# Patient Record
Sex: Female | Born: 1960 | Race: White | Hispanic: Yes | Marital: Married | Smoking: Never smoker
Health system: Southern US, Community
[De-identification: ages and names within clinical notes are randomized; demographics above are authoritative.]

## PROBLEM LIST (undated history)

## (undated) DIAGNOSIS — E785 Hyperlipidemia, unspecified: Secondary | ICD-10-CM

## (undated) DIAGNOSIS — E079 Disorder of thyroid, unspecified: Secondary | ICD-10-CM

---

## 2007-07-26 ENCOUNTER — Ambulatory Visit (HOSPITAL_COMMUNITY): Admission: RE | Admit: 2007-07-26 | Discharge: 2007-07-26 | Payer: Self-pay | Admitting: Obstetrics & Gynecology

## 2008-08-05 ENCOUNTER — Ambulatory Visit (HOSPITAL_COMMUNITY): Admission: RE | Admit: 2008-08-05 | Discharge: 2008-08-05 | Payer: Self-pay | Admitting: Obstetrics and Gynecology

## 2009-08-06 ENCOUNTER — Ambulatory Visit (HOSPITAL_COMMUNITY): Admission: RE | Admit: 2009-08-06 | Discharge: 2009-08-06 | Payer: Self-pay | Admitting: Obstetrics & Gynecology

## 2010-05-11 ENCOUNTER — Encounter (INDEPENDENT_AMBULATORY_CARE_PROVIDER_SITE_OTHER): Payer: Self-pay | Admitting: *Deleted

## 2010-05-11 LAB — CONVERTED CEMR LAB
Albumin: 4.4 g/dL (ref 3.5–5.2)
BUN: 7 mg/dL (ref 6–23)
Calcium: 9.9 mg/dL (ref 8.4–10.5)
Chlamydia, DNA Probe: NEGATIVE
Chloride: 102 meq/L (ref 96–112)
Cholesterol: 204 mg/dL — ABNORMAL HIGH (ref 0–200)
Glucose, Bld: 92 mg/dL (ref 70–99)
Hemoglobin: 12.2 g/dL (ref 12.0–15.0)
Platelets: 369 10*3/uL (ref 150–400)
Sodium: 139 meq/L (ref 135–145)
TSH: 2.109 microintl units/mL (ref 0.350–4.500)
Total CHOL/HDL Ratio: 5.7
VLDL: 54 mg/dL — ABNORMAL HIGH (ref 0–40)

## 2010-08-03 ENCOUNTER — Other Ambulatory Visit (HOSPITAL_COMMUNITY): Payer: Self-pay | Admitting: Family Medicine

## 2010-08-03 ENCOUNTER — Ambulatory Visit (HOSPITAL_COMMUNITY)
Admission: RE | Admit: 2010-08-03 | Discharge: 2010-08-03 | Disposition: A | Discharge status: Home / Self Care | Payer: Self-pay | Source: Ambulatory Visit | Attending: Family Medicine | Admitting: Family Medicine

## 2010-08-03 DIAGNOSIS — R7611 Nonspecific reaction to tuberculin skin test without active tuberculosis: Secondary | ICD-10-CM

## 2010-11-25 ENCOUNTER — Inpatient Hospital Stay (INDEPENDENT_AMBULATORY_CARE_PROVIDER_SITE_OTHER)
Admission: RE | Admit: 2010-11-25 | Discharge: 2010-11-25 | Disposition: A | Discharge status: Home / Self Care | Payer: Self-pay | Source: Ambulatory Visit | Attending: Emergency Medicine | Admitting: Emergency Medicine

## 2010-11-25 DIAGNOSIS — J040 Acute laryngitis: Secondary | ICD-10-CM

## 2010-11-25 DIAGNOSIS — J069 Acute upper respiratory infection, unspecified: Secondary | ICD-10-CM

## 2011-08-02 ENCOUNTER — Other Ambulatory Visit (HOSPITAL_COMMUNITY): Payer: Self-pay | Admitting: Family Medicine

## 2011-08-02 ENCOUNTER — Other Ambulatory Visit: Payer: Self-pay | Admitting: Family Medicine

## 2011-08-02 DIAGNOSIS — Z1231 Encounter for screening mammogram for malignant neoplasm of breast: Secondary | ICD-10-CM

## 2011-08-29 ENCOUNTER — Ambulatory Visit (HOSPITAL_COMMUNITY)
Admission: RE | Admit: 2011-08-29 | Discharge: 2011-08-29 | Disposition: A | Discharge status: Home / Self Care | Payer: Self-pay | Source: Ambulatory Visit | Attending: Family Medicine | Admitting: Family Medicine

## 2011-08-29 DIAGNOSIS — Z1231 Encounter for screening mammogram for malignant neoplasm of breast: Secondary | ICD-10-CM | POA: Insufficient documentation

## 2012-06-20 ENCOUNTER — Encounter (HOSPITAL_COMMUNITY): Payer: Self-pay | Admitting: *Deleted

## 2012-06-20 ENCOUNTER — Emergency Department (HOSPITAL_COMMUNITY)
Admission: EM | Admit: 2012-06-20 | Discharge: 2012-06-20 | Disposition: A | Discharge status: Home / Self Care | Payer: No Typology Code available for payment source | Source: Home / Self Care | Attending: Emergency Medicine | Admitting: Emergency Medicine

## 2012-06-20 DIAGNOSIS — J019 Acute sinusitis, unspecified: Secondary | ICD-10-CM

## 2012-06-20 DIAGNOSIS — J209 Acute bronchitis, unspecified: Secondary | ICD-10-CM

## 2012-06-20 HISTORY — DX: Hyperlipidemia, unspecified: E78.5

## 2012-06-20 MED ORDER — AMOXICILLIN 500 MG PO CAPS: 1000.0000 mg | ORAL_CAPSULE | Freq: Three times a day (TID) | ORAL | Status: DC

## 2012-06-20 MED ORDER — FEXOFENADINE-PSEUDOEPHED ER 60-120 MG PO TB12: 1.0000 | ORAL_TABLET | Freq: Two times a day (BID) | ORAL | Status: DC

## 2012-06-20 MED ORDER — BENZONATATE 200 MG PO CAPS: 200.0000 mg | ORAL_CAPSULE | Freq: Three times a day (TID) | ORAL | Status: DC | PRN

## 2012-06-20 MED ORDER — ALBUTEROL SULFATE HFA 108 (90 BASE) MCG/ACT IN AERS: 1.0000 | INHALATION_SPRAY | Freq: Four times a day (QID) | RESPIRATORY_TRACT | Status: DC | PRN

## 2012-06-20 NOTE — ED Provider Notes (Signed)
Chief Complaint  Patient presents with  . Influenza    History of Present Illness:   The patient is a 52 year old female with a one-week history of body aches, headache, sore throat, has felt feverish, has had cough productive yellow sputum, wheezing, chest pain, and nasal congestion. She denies fever, chills, or GI symptoms. She has not been exposed to anything in particular. She has not tried anything for symptomatic relief except for Mucinex D.  Review of Systems:  Other than noted above, the patient denies any of the following symptoms. Systemic:  No fever, chills, sweats, fatigue, myalgias, headache, or anorexia. Eye:  No redness, pain or drainage. ENT:  No earache, ear congestion, nasal congestion, sneezing, rhinorrhea, sinus pressure, sinus pain, post nasal drip, or sore throat. Lungs:  No cough, sputum production, wheezing, shortness of breath, or chest pain. GI:  No abdominal pain, nausea, vomiting, or diarrhea.  PMFSH:  Past medical history, family history, social history, meds, and allergies were reviewed.  Physical Exam:   Vital signs:  BP 124/68  Pulse 90  Temp 98.2 F (36.8 C) (Oral)  Resp 18  SpO2 99%  LMP 08/20/2011 General:  Alert, in no distress. Eye:  No conjunctival injection or drainage. Lids were normal. ENT:  TMs and canals were normal, without erythema or inflammation.  Nasal mucosa was clear and uncongested, without drainage.  Mucous membranes were moist.  Pharynx was clear, without exudate or drainage.  There were no oral ulcerations or lesions. Neck:  Supple, no adenopathy, tenderness or mass. Lungs:  No respiratory distress.  Lungs were clear to auscultation, without wheezes, rales or rhonchi.  Breath sounds were clear and equal bilaterally.  Heart:  Regular rhythm, without gallops, murmers or rubs. Skin:  Clear, warm, and dry, without rash or lesions.  Assessment:  The primary encounter diagnosis was Acute bronchitis. A diagnosis of Acute sinusitis was  also pertinent to this visit.  Plan:   1.  The following meds were prescribed:   New Prescriptions   ALBUTEROL (PROVENTIL HFA;VENTOLIN HFA) 108 (90 BASE) MCG/ACT INHALER    Inhale 1-2 puffs into the lungs every 6 (six) hours as needed for wheezing.   AMOXICILLIN (AMOXIL) 500 MG CAPSULE    Take 2 capsules (1,000 mg total) by mouth 3 (three) times daily.   BENZONATATE (TESSALON) 200 MG CAPSULE    Take 1 capsule (200 mg total) by mouth 3 (three) times daily as needed for cough.   FEXOFENADINE-PSEUDOEPHEDRINE (ALLEGRA-D) 60-120 MG PER TABLET    Take 1 tablet by mouth every 12 (twelve) hours.   2.  The patient was instructed in symptomatic care and handouts were given. 3.  The patient was told to return if becoming worse in any way, if no better in 3 or 4 days, and given some red flag symptoms that would indicate earlier return.   Reuben Likes, MD 06/20/12 1110

## 2012-06-20 NOTE — ED Notes (Signed)
Pt reports flu like symptoms, body aches, sore throat, fever, cough & congestion - no relief from otc meds

## 2013-04-18 ENCOUNTER — Encounter (HOSPITAL_COMMUNITY): Payer: Self-pay

## 2013-04-22 ENCOUNTER — Other Ambulatory Visit: Payer: Self-pay | Admitting: Obstetrics and Gynecology

## 2013-04-22 ENCOUNTER — Encounter (INDEPENDENT_AMBULATORY_CARE_PROVIDER_SITE_OTHER): Payer: Self-pay

## 2013-04-22 ENCOUNTER — Encounter (HOSPITAL_COMMUNITY): Payer: Self-pay

## 2013-04-22 ENCOUNTER — Ambulatory Visit (HOSPITAL_COMMUNITY)
Admission: RE | Admit: 2013-04-22 | Discharge: 2013-04-22 | Disposition: A | Discharge status: Home / Self Care | Payer: Self-pay | Source: Ambulatory Visit | Attending: Obstetrics and Gynecology | Admitting: Obstetrics and Gynecology

## 2013-04-22 VITALS — BP 100/64 | Temp 98.2°F | Ht 60.0 in | Wt 151.0 lb

## 2013-04-22 DIAGNOSIS — Z1239 Encounter for other screening for malignant neoplasm of breast: Secondary | ICD-10-CM

## 2013-04-22 DIAGNOSIS — Z1231 Encounter for screening mammogram for malignant neoplasm of breast: Secondary | ICD-10-CM | POA: Insufficient documentation

## 2013-04-22 DIAGNOSIS — Z0000 Encounter for general adult medical examination without abnormal findings: Secondary | ICD-10-CM

## 2013-04-22 DIAGNOSIS — R87610 Atypical squamous cells of undetermined significance on cytologic smear of cervix (ASC-US): Secondary | ICD-10-CM | POA: Insufficient documentation

## 2013-04-22 DIAGNOSIS — R87619 Unspecified abnormal cytological findings in specimens from cervix uteri: Secondary | ICD-10-CM | POA: Insufficient documentation

## 2013-04-22 HISTORY — DX: Disorder of thyroid, unspecified: E07.9

## 2013-04-22 NOTE — Patient Instructions (Signed)
Taught Stacy Wheeler how to perform BSE and gave educational materials to take home. Patient did not need a Pap smear today due to last Pap smear was 03/03/2013. Told patient she will need a repeat/follow-up Pap smear in March 2015. Let patient know will follow up with her within the next couple weeks with results for mammogram by letter or phone. Stacy Wheeler verbalized understanding. Patient escorted to mammography for a screening mammogram.  Lindell Tussey, Kathaleen Maser, RN 3:33 PM

## 2013-04-22 NOTE — Progress Notes (Signed)
Patient referred to [facility] by the [facility] due to an abnormal Pap smear 12/27/2012 recommending follow up.  Pap Smear:    Pap smear not completed today. Last Pap smear was 03/03/2013 at Dr. Jearl Klinefelter office and normal. Patient has a recent history of an abnormal Pap smear 12/27/2012 at the [facility] Department ASCUS HPV+ per patient was her first abnormal Pap smear. Patient needs a follow up Pap smear in 6 months.  Pap smear result from 12/27/2012 is scanned in EPIC under media. Result on 03/03/2013 will be scanned in EPIC under media.  Physical exam: Breasts Breasts symmetrical. No skin abnormalities bilateral breasts. No nipple retraction bilateral breasts. No nipple discharge bilateral breasts. No lymphadenopathy. No lumps palpated bilateral breasts. No complaints of pain or tenderness on exam. Patient escorted to mammography for a screening mammogram.        Pelvic/Bimanual No Pap smear completed today since last Pap smear was 03/03/2013. Pap smear not indicated per [facility]CP guidelines.

## 2013-05-19 ENCOUNTER — Encounter: Payer: Self-pay | Admitting: Obstetrics and Gynecology

## 2013-05-19 ENCOUNTER — Ambulatory Visit (INDEPENDENT_AMBULATORY_CARE_PROVIDER_SITE_OTHER): Payer: Self-pay | Admitting: Obstetrics and Gynecology

## 2013-05-19 ENCOUNTER — Other Ambulatory Visit (HOSPITAL_COMMUNITY)
Admission: RE | Admit: 2013-05-19 | Discharge: 2013-05-19 | Disposition: A | Discharge status: Home / Self Care | Payer: Self-pay | Source: Ambulatory Visit | Attending: Obstetrics and Gynecology | Admitting: Obstetrics and Gynecology

## 2013-05-19 VITALS — BP 129/78 | HR 84 | Temp 97.6°F | Ht 61.0 in | Wt 152.6 lb

## 2013-05-19 DIAGNOSIS — R87810 Cervical high risk human papillomavirus (HPV) DNA test positive: Secondary | ICD-10-CM

## 2013-05-19 DIAGNOSIS — R87610 Atypical squamous cells of undetermined significance on cytologic smear of cervix (ASC-US): Secondary | ICD-10-CM

## 2013-05-19 DIAGNOSIS — Z01812 Encounter for preprocedural laboratory examination: Secondary | ICD-10-CM

## 2013-05-19 NOTE — Progress Notes (Signed)
Patient ID: Stacy Wheeler, female   DOB: 08-07-1960, 52 y.o.   MRN: [id number] 52 yo G4P4 with ASCUS pap +HPV here for colposcopy  Patient given informed consent, signed copy in the chart, time out was performed.  Placed in lithotomy position. Cervix viewed with speculum and colposcope after application of acetic acid.   Colposcopy adequate?  No, TZ not visualized Acetowhite lesions?no. Atrophic appearing cervix  Punctation?no Mosaicism?  no Abnormal vasculature?  no Biopsies?no ECC?yes  COMMENTS: Patient was given post procedure instructions.  She will return in 6 months for repeat pap smear.

## 2013-05-21 ENCOUNTER — Encounter: Payer: Self-pay | Admitting: *Deleted

## 2013-10-21 ENCOUNTER — Encounter (HOSPITAL_COMMUNITY): Payer: Self-pay

## 2013-10-21 ENCOUNTER — Ambulatory Visit (HOSPITAL_COMMUNITY)
Admission: RE | Admit: 2013-10-21 | Discharge: 2013-10-21 | Disposition: A | Discharge status: Home / Self Care | Payer: Self-pay | Source: Ambulatory Visit | Attending: Obstetrics and Gynecology | Admitting: Obstetrics and Gynecology

## 2013-10-21 ENCOUNTER — Encounter (INDEPENDENT_AMBULATORY_CARE_PROVIDER_SITE_OTHER): Payer: Self-pay

## 2013-10-21 VITALS — BP 112/78 | Temp 98.2°F | Ht 60.0 in | Wt 157.4 lb

## 2013-10-21 DIAGNOSIS — Z01419 Encounter for gynecological examination (general) (routine) without abnormal findings: Secondary | ICD-10-CM

## 2013-10-21 NOTE — Patient Instructions (Addendum)
Next Pap smear and follow-up will be based on the results of today's Pap smear. Let patient know will follow up with her within the next couple weeks with results by phone. Told patient she will need a mammogram in November and that she will need  to call Martie LeeSabrina to renew her B[facility]CP card for it to be covered by [facility].  Franciso Bendeina Ventura de Sherlon HandingRodriguez verbalized understanding.  Saintclair Halstedhristine Poll [place], RN 11:03 AM

## 2013-10-21 NOTE — Progress Notes (Signed)
No complaints today.  Pap Smear:  Pap smear completed today. Last Pap smear was 03/03/2013 at Dr. Jearl KlinefelterHooper's office and normal. Pap smear completed today due to the recommendation of a repeat Pap smear in 6 months. Patient has a recent history of an abnormal Pap [facility] ASCUS HPV+ per patient was her first abnormal Pap smear. Patient needs a follow up Pap smear in 6 months. Pap smear result from 12/27/2012 is scanned in EPIC under media. Result on 03/03/2013 will be scanned in EPIC under media.    Pelvic/Bimanual   Ext Genitalia No lesions, no swelling and no discharge observed on external genitalia.         Vagina Vagina pink and normal texture. No lesions or discharge observed in vagina.          Cervix Cervix is present. Cervix pink and of normal texture. No discharge observed.     Uterus Uterus is present and palpable. Uterus in normal position and normal size.        Adnexae Bilateral ovaries present and palpable. No tenderness on palpation.          Rectovaginal No rectal exam completed today since patient had no rectal complaints. No skin abnormalities observed on exam.

## 2013-10-23 ENCOUNTER — Telehealth (HOSPITAL_COMMUNITY): Payer: Self-pay | Admitting: *Deleted

## 2013-10-23 NOTE — Telephone Encounter (Signed)
Telephoned patient at home # and discussed normal pap smear results. Next pap smear due in one year. Patient voiced understanding. Used interpreter Delorise RoyalsJulie Sowell.

## 2013-10-31 ENCOUNTER — Other Ambulatory Visit: Payer: Self-pay

## 2013-10-31 ENCOUNTER — Ambulatory Visit (HOSPITAL_BASED_OUTPATIENT_CLINIC_OR_DEPARTMENT_OTHER): Payer: Self-pay

## 2013-10-31 VITALS — BP 110/72 | HR 62 | Temp 98.0°F | Resp 12 | Ht 61.0 in | Wt 158.6 lb

## 2013-10-31 DIAGNOSIS — Z0000 Encounter for general adult medical examination without abnormal findings: Secondary | ICD-10-CM

## 2013-10-31 LAB — LIPID PANEL
Cholesterol: 222 mg/dL — ABNORMAL HIGH (ref 0–200)
HDL: 41 mg/dL (ref >39)
LDL Cholesterol: 155 mg/dL — ABNORMAL HIGH (ref 0–99)
Total CHOL/HDL Ratio: 5.4 Ratio
Triglycerides: 132 mg/dL (ref <150)
VLDL: 26 mg/dL (ref 0–40)

## 2013-10-31 LAB — HEMOGLOBIN A1C
HEMOGLOBIN A1C: 6.2 % — AB (ref <5.7)
MEAN PLASMA GLUCOSE: 131 mg/dL — AB (ref <117)

## 2013-10-31 LAB — GLUCOSE (CC13): GLUCOSE: 89 mg/dL (ref 70–140)

## 2013-10-31 NOTE — Progress Notes (Signed)
Patient is a [facility] and is currently a [facility] patient effective 04/22/2013.   Clinical Measurements: Patient is 5 ft. 1 inches, weight 158.6 lbs, waist circumference 42 inches, and hip circumference 33 inches.   Medical History: Patient has history of high cholesterol but tale her own formula of aloe and other ingredients.. Patient does not have a history of hypertension or diabetes. Per patient no diagnosed history of coronary heart disease, heart attack, heart failure, stroke/TIA, vascular disease or congenital heart defects.   Blood Pressure, Self-measurement: Patient states has no reason to check Blood pressure.  Nutrition Assessment: Patient stated that eats 2-3 fruits every day. Patient states she eats 0 to one servings of vegetables a day. Patient eats 3 or more ounces of whole grains daily. Patient doesn't eat two or more servings of fish weekly. . Patient states she does not drink more than 36 ounces or 450 calories of beverages with added sugars weekly. Patient states she drinks a lot of water. Patient stated she does watch her salt intake.  Physical Activity Assessment: Patient stated she does around 855 minutes of walking and household chores a week. Does no vigorous activity.  Smoking Status: Patient had never smoked.   Quality of Life Assessment: In assessing patient's quality of life she stated that out of the past 30 days that she has felt her health is good all of them. Patient also stated that in the past 30 days that her mental health is not good including stress, depression and problems with emotions for 5 days. Patient did state that out of the past 30 days she felt her physical or mental health had not kept her from doing her usual activities including self-care, work or recreation.   Plan: Lab work will be done today including a lipid panel, blood glucose, and Hgb A1C. Will call lab results when they are finished. Patient will return for Health  Coaching and BP check. Will work on obtaining assistance for her child with mental health issues.

## 2013-10-31 NOTE — Patient Instructions (Addendum)
  Discussed health assessment with patient. She will be called with results of lab work.Will schedule Health Coaching in future.. Patient verbalized understanding.

## 2013-11-07 ENCOUNTER — Telehealth: Payer: Self-pay

## 2013-11-07 NOTE — Telephone Encounter (Signed)
Interpreter, Butler Denmark patient lab results: Bld. Glucose - 89, HgbA1C -6.2, Cholesterol 222, HDL - 41, LDL - 155, Triglycerides - 132. Working on getting doctor appointment. Scheduled Health Coach appointment for November 21, 2013 to disuss ways to improve Cholesterol, LDL's and A1C level. Patient verbalized understanding.

## 2013-11-20 ENCOUNTER — Ambulatory Visit: Payer: Self-pay | Admitting: Internal Medicine

## 2013-11-21 ENCOUNTER — Ambulatory Visit: Payer: Self-pay

## 2013-11-21 DIAGNOSIS — Z789 Other specified health status: Secondary | ICD-10-CM

## 2013-11-21 NOTE — Progress Notes (Signed)
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   NUTRITION: Patient had appointment [facility] on June fourth but was not informed about the appointment due to no interpreter available to call. Discussed watching carbohydrates, how to count them, serving sizes and number of carbs per day allowance. Discussed the need to increase fiber in diet. Ng ngPatient received and reviewed the following handouts in Spanish: two different My Plate, and Carb counting Menu. Gave patient measuring cup to measure serving sizes and demonstrated the serving sizes.  ACTIVITY: Discussed increasing activity by walking. Patient received pedometer and was explained and shown how it works.  Miscellaneous: Discussed intensively patients best option for follow up care including [facility] and [employer].   PLAN:  Pursue orange card at [employer] and [employer].Increasing amount of walking per day and add other exercises to plan, Decrease carbohydrates in diet

## 2013-11-21 NOTE — Patient Instructions (Signed)
Patient will keep count of carbohydrates that eat in a day. Measure serving sizes with measuring cup. Decrease number of carbohydrates eat to less than 200 Gm a day. Wear pedometer and walk at least 5,000 steps a day increasing to 10,000 steps a day. Call if have any questions.

## 2014-04-20 ENCOUNTER — Encounter (HOSPITAL_COMMUNITY): Payer: Self-pay

## 2015-02-19 ENCOUNTER — Ambulatory Visit: Payer: Self-pay | Attending: Internal Medicine

## 2015-03-02 ENCOUNTER — Ambulatory Visit: Payer: Self-pay | Attending: Internal Medicine

## 2015-03-02 ENCOUNTER — Ambulatory Visit: Payer: Self-pay

## 2016-09-19 ENCOUNTER — Other Ambulatory Visit: Payer: Self-pay | Admitting: Obstetrics and Gynecology

## 2016-09-19 DIAGNOSIS — Z1231 Encounter for screening mammogram for malignant neoplasm of breast: Secondary | ICD-10-CM

## 2016-10-03 ENCOUNTER — Encounter (HOSPITAL_COMMUNITY): Payer: Self-pay

## 2016-10-03 ENCOUNTER — Ambulatory Visit
Admission: RE | Admit: 2016-10-03 | Discharge: 2016-10-03 | Disposition: A | Discharge status: Home / Self Care | Payer: No Typology Code available for payment source | Source: Ambulatory Visit | Attending: Obstetrics and Gynecology | Admitting: Obstetrics and Gynecology

## 2016-10-03 ENCOUNTER — Ambulatory Visit (HOSPITAL_COMMUNITY)
Admission: RE | Admit: 2016-10-03 | Discharge: 2016-10-03 | Disposition: A | Discharge status: Home / Self Care | Payer: Self-pay | Source: Ambulatory Visit | Attending: Obstetrics and Gynecology | Admitting: Obstetrics and Gynecology

## 2016-10-03 VITALS — BP 142/96 | Temp 98.2°F | Ht 62.0 in | Wt 162.4 lb

## 2016-10-03 DIAGNOSIS — Z1231 Encounter for screening mammogram for malignant neoplasm of breast: Secondary | ICD-10-CM

## 2016-10-03 DIAGNOSIS — Z01419 Encounter for gynecological examination (general) (routine) without abnormal findings: Secondary | ICD-10-CM

## 2016-10-03 NOTE — Patient Instructions (Signed)
Explained breast self awareness with Franciso Bend de Sherlon Handing. Let patient know that if today's Pap smear is normal that her next Pap smear is due in one year due to her recent history of an abnormal Pap smear. Referred patient to the [facility] for a screening mammogram. Appointment scheduled for Tuesday, October 03, 2016 at 0930. Let patient know will follow up with her within the next couple weeks with results of Pap smear by phone. Informed patient that the Breast Center will follow up with her within the next couple of weeks with results of mammogram by letter or phone. Franciso Bend de Sherlon Handing verbalized understanding.  Berdell Hostetler, Kathaleen Maser, RN 12:13 PM

## 2016-10-03 NOTE — Progress Notes (Signed)
Pap smear completed

## 2016-10-03 NOTE — Progress Notes (Signed)
No complaints today.   Pap Smear: Pap smear completed today. Last Pap smear was 10/21/2013 in [facility] clinic and normal. Patient has a history of an abnormal Pap smear 12/27/2012 at the [facility] Department ASCUS HPV+ per patient was her first abnormal Pap smear and a repeat Pap smear in 6 months was recommended. Pap smear result from 12/27/2012 is scanned in EPIC under media.   Physical exam: Breasts Breasts symmetrical. No skin abnormalities bilateral breasts. No nipple retraction bilateral breasts. No nipple discharge bilateral breasts. No lymphadenopathy. No lumps palpated bilateral breasts. No complaints of pain or tenderness on exam. Referred patient to the [facility] for a screening mammogram. Appointment scheduled for Tuesday, October 03, 2016 at 0930.  Pelvic/Bimanual   Ext Genitalia No lesions, no swelling and no discharge observed on external genitalia.         Vagina Vagina pink and normal texture. No lesions or discharge observed in vagina.          Cervix Cervix is present. Cervix pink and of normal texture. No discharge observed.     Uterus Uterus is present and palpable. Uterus in normal position and normal size.        Adnexae Bilateral ovaries present and palpable. No tenderness on palpation.          Rectovaginal No rectal exam completed today since patient had no rectal complaints. No skin abnormalities observed on exam.    Smoking History: Patient has never smoked.  Patient Navigation: Patient education provided. Access to services provided for patient through [facility] program. Spanish interpreter provided.   Colorectal Cancer Screening: Per patient has never had a colonoscopy completed. No complaints today. FIT Test given to patient to complete and return to [facility].  Used Spanish interpreter [employer] from [facility].

## 2016-10-04 LAB — CYTOLOGY - PAP
Diagnosis: NEGATIVE
HPV: NOT DETECTED

## 2016-10-05 ENCOUNTER — Other Ambulatory Visit: Payer: Self-pay | Admitting: Obstetrics and Gynecology

## 2016-10-05 DIAGNOSIS — R928 Other abnormal and inconclusive findings on diagnostic imaging of breast: Secondary | ICD-10-CM

## 2016-10-06 ENCOUNTER — Encounter (HOSPITAL_COMMUNITY): Payer: Self-pay | Admitting: *Deleted

## 2016-10-06 ENCOUNTER — Other Ambulatory Visit: Payer: Self-pay | Admitting: Family Medicine

## 2016-10-12 ENCOUNTER — Ambulatory Visit
Admission: RE | Admit: 2016-10-12 | Discharge: 2016-10-12 | Disposition: A | Discharge status: Home / Self Care | Payer: No Typology Code available for payment source | Source: Ambulatory Visit | Attending: Obstetrics and Gynecology | Admitting: Obstetrics and Gynecology

## 2016-10-12 ENCOUNTER — Other Ambulatory Visit: Payer: Self-pay | Admitting: Obstetrics and Gynecology

## 2016-10-12 DIAGNOSIS — R928 Other abnormal and inconclusive findings on diagnostic imaging of breast: Secondary | ICD-10-CM

## 2016-10-12 DIAGNOSIS — N6320 Unspecified lump in the left breast, unspecified quadrant: Secondary | ICD-10-CM

## 2016-10-17 ENCOUNTER — Telehealth (HOSPITAL_COMMUNITY): Payer: Self-pay | Admitting: *Deleted

## 2016-10-17 NOTE — Telephone Encounter (Signed)
Telephoned patient at home number and advised patient of negative pap smear results. HPV was negative. Next pap smear due in one year due to history of abnormal pap smears. Patient voiced understanding. Used interpreter Delorise Royals.

## 2016-10-19 ENCOUNTER — Ambulatory Visit
Admission: RE | Admit: 2016-10-19 | Discharge: 2016-10-19 | Disposition: A | Discharge status: Home / Self Care | Payer: No Typology Code available for payment source | Source: Ambulatory Visit | Attending: Obstetrics and Gynecology | Admitting: Obstetrics and Gynecology

## 2016-10-19 ENCOUNTER — Other Ambulatory Visit: Payer: Self-pay | Admitting: Obstetrics and Gynecology

## 2016-10-19 DIAGNOSIS — N6320 Unspecified lump in the left breast, unspecified quadrant: Secondary | ICD-10-CM

## 2017-02-20 ENCOUNTER — Telehealth (HOSPITAL_COMMUNITY): Payer: Self-pay

## 2017-02-20 NOTE — Telephone Encounter (Signed)
Interpreter Stacy Wheeler called patient to remind her about the at home FIT Test [place]. Patient stated that she mailed it in already but we have no record of receiving it. We informed the patient that we would mail her a new one to complete.

## 2017-03-09 ENCOUNTER — Other Ambulatory Visit: Payer: Self-pay

## 2017-04-07 LAB — FECAL OCCULT BLOOD, IMMUNOCHEMICAL

## 2017-04-09 ENCOUNTER — Telehealth (HOSPITAL_COMMUNITY): Payer: Self-pay | Admitting: *Deleted

## 2017-04-09 NOTE — Telephone Encounter (Signed)
Telephoned patient at home number. She mailed her second Fit Test and testing was not performed due to the age of the test. Patient does not want to repeat. Used interpreter Delorise RoyalsJulie Sowell.

## 2017-05-23 ENCOUNTER — Encounter (HOSPITAL_COMMUNITY): Payer: Self-pay

## 2017-10-19 ENCOUNTER — Other Ambulatory Visit: Payer: Self-pay

## 2017-10-19 ENCOUNTER — Encounter: Payer: Self-pay | Admitting: Family Medicine

## 2017-10-19 ENCOUNTER — Ambulatory Visit: Payer: Self-pay | Admitting: Family Medicine

## 2017-10-19 VITALS — BP 122/78 | HR 87 | Temp 98.5°F | Ht 62.0 in | Wt 161.6 lb

## 2017-10-19 DIAGNOSIS — E785 Hyperlipidemia, unspecified: Secondary | ICD-10-CM

## 2017-10-19 DIAGNOSIS — Z1211 Encounter for screening for malignant neoplasm of colon: Secondary | ICD-10-CM

## 2017-10-19 DIAGNOSIS — Z0000 Encounter for general adult medical examination without abnormal findings: Secondary | ICD-10-CM

## 2017-10-19 DIAGNOSIS — K5900 Constipation, unspecified: Secondary | ICD-10-CM

## 2017-10-19 MED ORDER — POLYETHYLENE GLYCOL 3350 17 GM/SCOOP PO POWD: 17.0000 g | Freq: Every day | ORAL | 1 refills | Status: DC

## 2017-10-19 NOTE — Progress Notes (Signed)
5/3/20192:49 PM  Stacy Wheeler 04/03/1961, 57 y.o. female [id number]  Chief Complaint  Patient presents with  . Annual Exam    HPI:   Patient is a 57 y.o. female with past medical history significant for HLP who presents today for CPE  Last CPE 09/2016 at [facility] Cervical Cancer Screening: 09/2016, neg pap and HPV Breast Cancer Screening: 09/2016, abnormal LEFT breast, had bx, fibroadenoma Colorectal Cancer Screening: never done Bone Density Testing: n/a HIV Screening: declines STI Screening: declines Seasonal Influenza Vaccination: gets at [facility] Td/Tdap Vaccination: unsure, will check at [facility] Pneumococcal Vaccination: n/a Frequency of Dental evaluation: has seen in years Frequency of Eye evaluation: sees yearly, wears eyeglasses   No flowsheet data found.   Depression screen PHQ 2/9 10/19/2017  Decreased Interest 0  Down, Depressed, Hopeless 0  PHQ - 2 Score 0    No Known Allergies  Prior to Admission medications   Medication Sig Start Date End Date Taking? Authorizing Provider  omega-3 acid ethyl esters (LOVAZA) 1 g capsule Take by mouth 2 (two) times daily.   Yes [provider]  aspirin 81 MG tablet Take 81 mg by mouth daily.    [provider]    Past Medical History:  Diagnosis Date  . Hyperlipemia   . Thyroid disease     Past Surgical History:  Procedure Laterality Date  . CESAREAN SECTION      Social History   Tobacco Use  . Smoking status: Never Smoker  . Smokeless tobacco: Never Used  Substance Use Topics  . Alcohol use: No  Married, safe All children in [place]  Family History  Problem Relation Age of Onset  . Cancer Maternal Uncle        liver  . Diabetes Maternal Grandmother   . Hypertension Maternal Grandmother     Review of Systems  Constitutional: Negative for chills and fever.  HENT: Negative for congestion, ear pain and sore throat.   Eyes: Negative for  blurred vision and double vision.  Respiratory: Negative for cough and shortness of breath.   Cardiovascular: Negative for chest pain, palpitations and leg swelling.  Gastrointestinal: Positive for blood in stool (only once a/w constipation) and constipation (occ, manages with warm water with lemon increase fruits). Negative for abdominal pain, diarrhea, melena, nausea and vomiting.  Genitourinary: Negative for dysuria and hematuria.       Neg breast lumps or nipple discharge Neg vaginal discharge, abnormal vaginal bleeding Pos menopause since 2015. Vaginal dryness and hotflashes  Musculoskeletal: Negative for falls, joint pain and myalgias.  Skin: Positive for itching and rash (mild eczema that responds to OTC 1% hydrocortisone).  Neurological: Negative for dizziness, sensory change, focal weakness and headaches.  Endo/Heme/Allergies: Negative for polydipsia.  Psychiatric/Behavioral: Negative for depression. The patient is not nervous/anxious.     OBJECTIVE:  Blood pressure 122/78, pulse 87, temperature 98.5 F (36.9 C), temperature source Oral, height  (1.575 m), weight 161 lb 9.6 oz (73.3 kg), SpO2 98 %.  Physical Exam  Constitutional: She is oriented to person, place, and time. She appears well-developed and well-nourished.  HENT:  Head: Normocephalic and atraumatic.  Right Ear: Hearing, tympanic membrane, external ear and ear canal normal.  Left Ear: Hearing, tympanic membrane, external ear and ear canal normal.  Mouth/Throat: Oropharynx is clear and moist.  Eyes: Pupils are equal, round, and reactive to light. Conjunctivae and EOM are normal.  Neck: Neck supple. No thyromegaly  present.  Cardiovascular: Normal rate, regular rhythm, normal heart sounds and intact distal pulses. Exam reveals no gallop and no friction rub.  No murmur heard. Pulmonary/Chest: Effort normal and breath sounds normal. She has no wheezes. She has no rales.  Abdominal: Soft. Bowel sounds are normal.  She exhibits no distension and no mass. There is no tenderness.  Musculoskeletal: Normal range of motion. She exhibits no edema.  Lymphadenopathy:    She has no cervical adenopathy.  Neurological: She is alert and oriented to person, place, and time. She has normal reflexes. No cranial nerve deficit. Gait normal.  Skin: Skin is warm and dry.  Psychiatric: She has a normal mood and affect.  Nursing note and vitals reviewed.    ASSESSMENT and PLAN  1. Annual physical exam No concerns per history or exam. Routine HCM labs ordered. HCM reviewed/discussed. Anticipatory guidance regarding healthy weight, lifestyle and choices given. Pap and mammo per BCC. - CBC  2. Hyperlipidemia, unspecified hyperlipidemia type - Lipid panel - Comprehensive metabolic panel - TSH  3. Constipation, unspecified constipation type - polyethylene glycol powder (GLYCOLAX/MIRALAX) powder; Take 17 g by mouth daily.  4. Colon cancer screening - IFOBT POC (occult bld, rslt in office); Future   Return in about 1 year (around 10/20/2018).    Myles Lipps, MD Primary Care at [facility] [address] [place], [place] [address] Ph.  [phone] Fax [phone]

## 2017-10-19 NOTE — Patient Instructions (Addendum)
IF you received an x-ray today, you will receive an invoice from [facility] Radiology. Please contact [facility]iology at [phone] with questions or concerns regarding your invoice.   IF you received labwork today, you will receive an invoice from [facility]. Please contact [facility] at [phone] with questions or concerns regarding your invoice.   Our billing staff will not be able to assist you with questions regarding bills from these companies.  You will be contacted with the lab results as soon as they are available. The fastest way to get your results is to activate your My Chart account. Instructions are located on the last page of this paperwork. If you have not heard from [place] regarding the results in 2 weeks, please contact this office.     Cuidados preventivos en las mujeres de 68 a 54 aos de edad Preventive Care 40-64 Years, Female Los cuidados preventivos hacen referencia a las opciones en cuanto al estilo de vida y a las visitas al mdico, las cuales pueden promover la salud y Musician. Qu incluyen los cuidados preventivos?  Un examen fsico anual. Esto tambin se conoce como control de bienestar anual.  Exmenes dentales [place] al ao.  Exmenes de la vista de rutina. Pregntele al mdico con qu frecuencia debe realizarse un control de la vista.  Opciones personales de estilo de vida, que incluyen lo siguiente: ? [employer] y las encas a diario. ? Realizar actividad fsica con regularidad. ? Tener una dieta saludable. ? Evitar el consumo de tabaco y drogas. ? Limitar el consumo de bebidas alcohlicas. ? Counsellor. ? Tomar una dosis baja de aspirina diariamente a partir de los 67 aos de [place]. ? Tomar los suplementos de vitaminas o minerales como se lo haya indicado el mdico. Qu sucede durante un control de bienestar anual? Los servicios y exmenes de deteccin realizados por su mdico durante el control de  bienestar anual dependern de su salud general, factores de riesgo de estilo de vida y los antecedentes familiares de enfermedades. Asesoramiento Su mdico puede preguntarle acerca de:  Consumo de alcohol.  Consumo de tabaco.  Consumo de drogas.  Bienestar emocional.  Bienestar en el hogar y las relaciones personales.  Actividad sexual.  Hbitos de alimentacin.  Trabajo y [place] laboral.  Mtodos anticonceptivos.  El ciclo menstrual.  Antecedentes de embarazo.  Pruebas de deteccin Pueden hacerle las siguientes pruebas o mediciones:  Estatura, peso e ndice de masa muscular [facility]).  Presin arterial.  Niveles de lpidos y colesterol. Estos se pueden verificar cada 5 aos o, con ms frecuencia, si usted tiene ms de 63 aos de edad.  Control de la piel.  Pruebas de deteccin de cncer de pulmn. Es posible que se le realice esta prueba de deteccin a partir de los 55 aos de edad, si ha fumado durante 30 aos un paquete diario y sigue fumando o dej el hbito en algn momento en los ltimos 15 aos.  Prueba de Personnel officer en las heces [facility]). Es posible que se le realice esta prueba todos los aos a partir de los 32 aos de [place].  Sigmoidoscopa o colonoscopa flexible. Es posible que se le realice una sigmoidoscopa cada 5 aos o una colonoscopa cada 10 aos a partir de los 48 aos de [place].  Anlisis de sangre para la deteccin de la hepatitis C.  Anlisis de sangre para la deteccin de la hepatitis B.  Anlisis de enfermedades de transmisin sexual (ETS).  Pruebas de deteccin de la diabetes. Esto se Set designer un control del azcar en la sangre (glucosa) despus de no haber comido durante un periodo de tiempo (ayuno). Es posible que se le realice esta prueba cada 1 a 3 tres aos.  Mamografa. Se puede realizar cada 1 o 2 aos. Hable con su mdico sobre cundo debe comenzar a Engineer, manufacturing de [place] regular. Esto depende de si tiene antecedentes  familiares de cncer de mama o no.  Pruebas de deteccin de cncer relacionado con las mutaciones del BRCA. Es posible que se los deba realizar si tiene antecedentes de cncer de mama, de ovario, de trompas o peritoneal.  Examen plvico y prueba de Papanicolaou. Esto se puede realizar cada 70aos a Renato Gails de los 21aos de edad. A partir de los 30 aos, esto se puede Optometrist cada 5 aos si usted se realiza una prueba de Papanicolaou en combinacin con una prueba de deteccin del virus del papiloma humano (VPH).  Densitometra sea. Esto se realiza para detectar osteoporosis. Se le puede realizar este examen de deteccin si tiene un riesgo alto de tener osteoporosis.  Hable con su mdico para [employer], las opciones de tratamiento y, si corresponde, la necesidad de Optometrist ms pruebas. Vacunas El mdico puede recomendarle que se aplique algunas vacunas, por ejemplo:  Vacuna contra la gripe. Se recomienda aplicarse esta vacuna todos los aos.  Vacuna contra la difteria, ttanos y tos Dietitian (DTPa, DT). Es posible que tenga que aplicarse un refuerzo contra el ttanos y la difteria (DT) cada 10aos.  Vacuna contra la varicela. Es posible que tenga que aplicrsela si no recibi esta vacuna.  Vacuna contra el herpes zster. Es posible que la necesite despus de los 70 aos de edad.  Vacuna contra el sarampin, rubola y paperas (SRP). Es posible que necesite aplicarse al menos una dosis de la vacuna SRP si naci despus de [phone]. Podra tambin necesitar una segunda dosis.  Vacuna antineumoccica conjugada 13 valente (PCV13). Puede necesitar esta vacuna si tiene determinadas enfermedades y no se vacun anteriormente.  Vacuna antineumoccica de polisacridos (PPSV23). Quizs tenga que aplicarse una o dos dosis si fuma o si sufre determinadas enfermedades.  Vacuna antimeningoccica. Puede necesitar esta vacuna si tiene determinadas afecciones.  Vacuna contra la hepatitis  A. Es posible que necesite esta vacuna si tiene ciertas afecciones o si viaja o trabaja en lugares en los que podra estar expuesto a la hepatitis A.  Vacuna contra la hepatitis B. Es posible que necesite esta vacuna si tiene ciertas afecciones o si viaja o trabaja en lugares en los que podra estar expuesto a la hepatitis B.  Vacuna contra antihaemophilus influenzae tipoB (Hib). Puede necesitar esta vacuna si tiene determinadas afecciones.  Hable con el mdico sobre qu pruebas de deteccin y qu vacunas necesita, y con qu frecuencia las necesita. Esta informacin no tiene Marine scientist el consejo del mdico. Asegrese de hacerle al mdico cualquier pregunta que tenga. Document Released: 10/17/2016 Document Revised: 10/17/2016 Document Reviewed: 04/06/2015 Elsevier Interactive Patient Education  2018 [employer].

## 2017-10-20 LAB — CBC
Hematocrit: 38.4 % (ref 34.0–46.6)
Hemoglobin: 12.3 g/dL (ref 11.1–15.9)
MCH: 28.7 pg (ref 26.6–33.0)
MCHC: 32 g/dL (ref 31.5–35.7)
MCV: 90 fL (ref 79–97)
Platelets: 389 10*3/uL — ABNORMAL HIGH (ref 150–379)
RBC: 4.29 x10E6/uL (ref 3.77–5.28)
RDW: 13.8 % (ref 12.3–15.4)
WBC: 7 10*3/uL (ref 3.4–10.8)

## 2017-10-20 LAB — LIPID PANEL
Chol/HDL Ratio: 6.4 ratio — ABNORMAL HIGH (ref 0.0–4.4)
Cholesterol, Total: 219 mg/dL — ABNORMAL HIGH (ref 100–199)
HDL: 34 mg/dL — ABNORMAL LOW (ref >39)
LDL Calculated: 140 mg/dL — ABNORMAL HIGH (ref 0–99)
Triglycerides: 225 mg/dL — ABNORMAL HIGH (ref 0–149)
VLDL Cholesterol Cal: 45 mg/dL — ABNORMAL HIGH (ref 5–40)

## 2017-10-20 LAB — COMPREHENSIVE METABOLIC PANEL
ALT: 21 IU/L (ref 0–32)
AST: 18 IU/L (ref 0–40)
Albumin/Globulin Ratio: 1.5 (ref 1.2–2.2)
Albumin: 4.7 g/dL (ref 3.5–5.5)
Alkaline Phosphatase: 88 IU/L (ref 39–117)
BUN/Creatinine Ratio: 8 — ABNORMAL LOW (ref 9–23)
BUN: 6 mg/dL (ref 6–24)
Bilirubin Total: 0.3 mg/dL (ref 0.0–1.2)
CO2: 24 mmol/L (ref 20–29)
Calcium: 10 mg/dL (ref 8.7–10.2)
Chloride: 102 mmol/L (ref 96–106)
Creatinine, Ser: 0.71 mg/dL (ref 0.57–1.00)
GFR calc Af Amer: 112 mL/min/{1.73_m2} (ref >59)
GFR calc non Af Amer: 97 mL/min/{1.73_m2} (ref >59)
Globulin, Total: 3.2 g/dL (ref 1.5–4.5)
Glucose: 108 mg/dL — ABNORMAL HIGH (ref 65–99)
Potassium: 4.3 mmol/L (ref 3.5–5.2)
Sodium: 141 mmol/L (ref 134–144)
Total Protein: 7.9 g/dL (ref 6.0–8.5)

## 2017-10-20 LAB — TSH: TSH: 4.46 u[IU]/mL (ref 0.450–4.500)

## 2017-10-27 ENCOUNTER — Encounter: Payer: Self-pay | Admitting: Family Medicine

## 2017-11-08 ENCOUNTER — Telehealth: Payer: Self-pay

## 2017-11-08 NOTE — Telephone Encounter (Signed)
Copied from CRM [id number]. Topic: Quick Communication - Lab Results >> Nov 06, 2017  2:41 PM Crist Infante wrote: Pt's daughter calling for pt about lab results. Daughter is not on DPR. Advised daughter a letter was mailed with results.  Pt would like a call back to discuss. But pt will need spanish speaking person to go over results with her.  Pt is hoping she could give the ok over the phone to discuss results with daughter

## 2017-11-09 NOTE — Telephone Encounter (Signed)
Left detailed VM regarding labs.

## 2020-10-11 ENCOUNTER — Other Ambulatory Visit: Payer: Self-pay | Admitting: Obstetrics and Gynecology

## 2020-10-11 DIAGNOSIS — Z1231 Encounter for screening mammogram for malignant neoplasm of breast: Secondary | ICD-10-CM

## 2020-10-19 ENCOUNTER — Ambulatory Visit
Admission: RE | Admit: 2020-10-19 | Discharge: 2020-10-19 | Disposition: A | Discharge status: Home / Self Care | Payer: No Typology Code available for payment source | Source: Ambulatory Visit | Attending: Obstetrics and Gynecology | Admitting: Obstetrics and Gynecology

## 2020-10-19 ENCOUNTER — Encounter (INDEPENDENT_AMBULATORY_CARE_PROVIDER_SITE_OTHER): Payer: Self-pay

## 2020-10-19 ENCOUNTER — Ambulatory Visit: Payer: Self-pay | Admitting: *Deleted

## 2020-10-19 ENCOUNTER — Other Ambulatory Visit: Payer: Self-pay

## 2020-10-19 VITALS — BP 124/80 | Wt 158.6 lb

## 2020-10-19 DIAGNOSIS — Z1239 Encounter for other screening for malignant neoplasm of breast: Secondary | ICD-10-CM

## 2020-10-19 DIAGNOSIS — Z1231 Encounter for screening mammogram for malignant neoplasm of breast: Secondary | ICD-10-CM

## 2020-10-19 NOTE — Progress Notes (Signed)
Stacy Wheeler is a 60 y.o. female who presents to [facility] clinic today with no complaints.    Pap Smear: Pap smear not completed today. Last Pap smear was 10/03/2016 at [facility] clinic and was normal with negative HPV. Patient has a history of an abnormal Pap smear 12/27/2012 at the [facility] Department ASCUS HPV+ per patient was her first abnormal Pap smear and a repeat Pap smear in 6 months was recommended. Pap smear result from 12/27/2012 is scanned in EPIC under media. Per patient she has had at least three normal Pap smears since abnormal. Last Pap smear result is available in Epic.    Physical exam: Breasts Breasts symmetrical. No skin abnormalities bilateral breasts. No nipple retraction bilateral breasts. No nipple discharge bilateral breasts. No lymphadenopathy. No lumps palpated bilateral breasts. No complaints of pain or tenderness on exam.       Pelvic/Bimanual Pap is not indicated today per BCCCP guidelines.   Smoking History: Patient has never smoked.   Patient Navigation: Patient education provided. Access to services provided for patient through [place] program. Spanish interpreter Alene Mires from [facility] provided.   Colorectal Cancer Screening: Per patient has never had colonoscopy completed. No complaints today.    Breast and Cervical Cancer Risk Assessment: Patient does not have family history of breast cancer, known genetic mutations, or radiation treatment to the chest before age 81. Patient does not have history of cervical dysplasia, immunocompromised, or DES exposure in-utero.  Risk Assessment    Risk Scores      10/19/2020   Last edited by: Narda Rutherford, LPN   5-year risk: 1 %   Lifetime risk: 6.5 %         A: BCCCP exam without pap smear No complaints.  P: Referred patient to the [facility] for a screening mammogram on the mobile unit. Appointment scheduled Tuesday, Oct 19, 2020 at 1530.  Priscille Heidelberg,  RN 10/19/2020 2:08 PM

## 2020-10-19 NOTE — Patient Instructions (Signed)
Explained breast self awareness with Franciso Bend de Sherlon Handing. Patient did not need a Pap smear today due to last Pap smear and HPV typing was 10/03/2016. Let her know BCCCP will cover Pap smears and HPV typing every 5 years unless has a history of abnormal Pap smears. Referred patient to the [facility] for a screening mammogram on the mobile unit. Appointment scheduled Tuesday, Oct 19, 2020 at 1530. Patient escorted to the mobile unit following BCCCP appointment for her screening mammogram.  Let patient know the Breast Center will follow up with her within the next couple weeks with results of her mammogram by letter or phone. Franciso Bend de Sherlon Handing verbalized understanding.  Horace Lukas, Kathaleen Maser, RN 2:09 PM

## 2021-06-01 IMAGING — MG MM DIGITAL SCREENING BILAT W/ TOMO AND CAD
8 series · 9 of 24 positions shown · non-contrast
Comparison: Previous exam(s).

CLINICAL DATA: Screening.

EXAM:
DIGITAL SCREENING BILATERAL MAMMOGRAM WITH TOMOSYNTHESIS AND CAD
TECHNIQUE: Bilateral screening digital craniocaudal and mediolateral oblique
mammograms were obtained. Bilateral screening digital breast
tomosynthesis was performed. The images were evaluated with
computer-aided detection.

[L CC synth-2D]
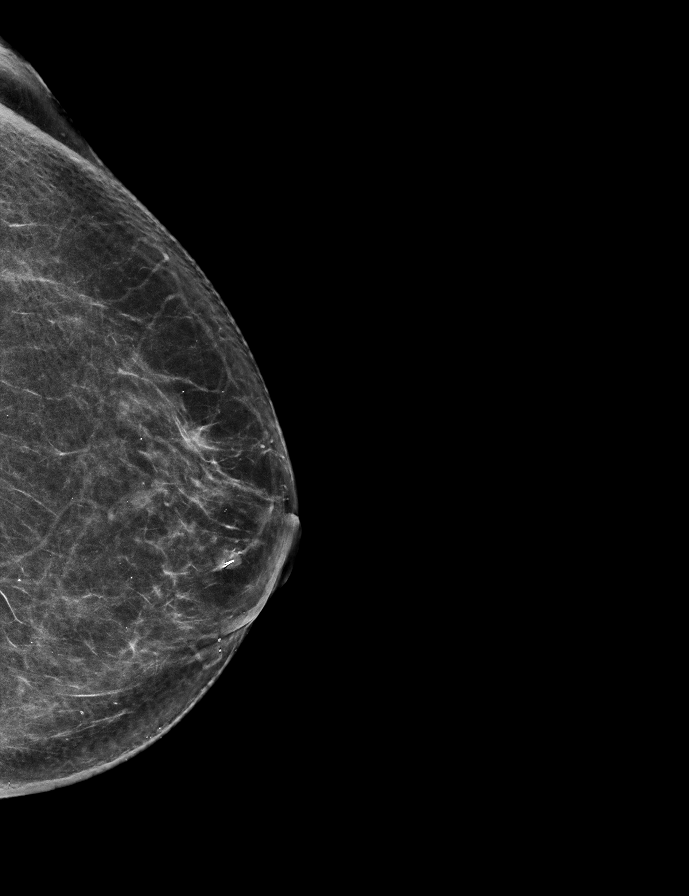

[R CC synth-2D]
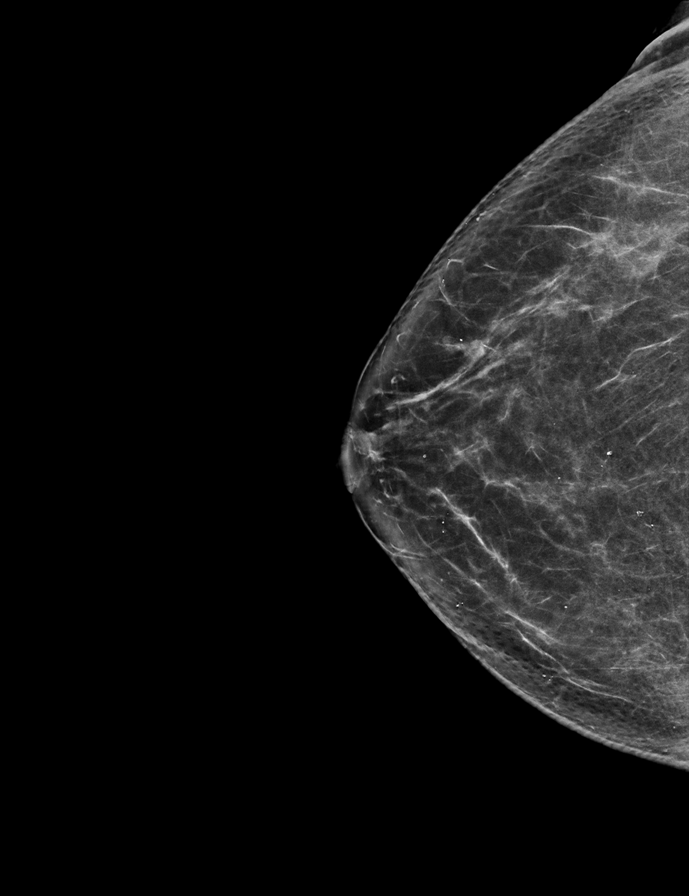

[R MLO synth-2D]
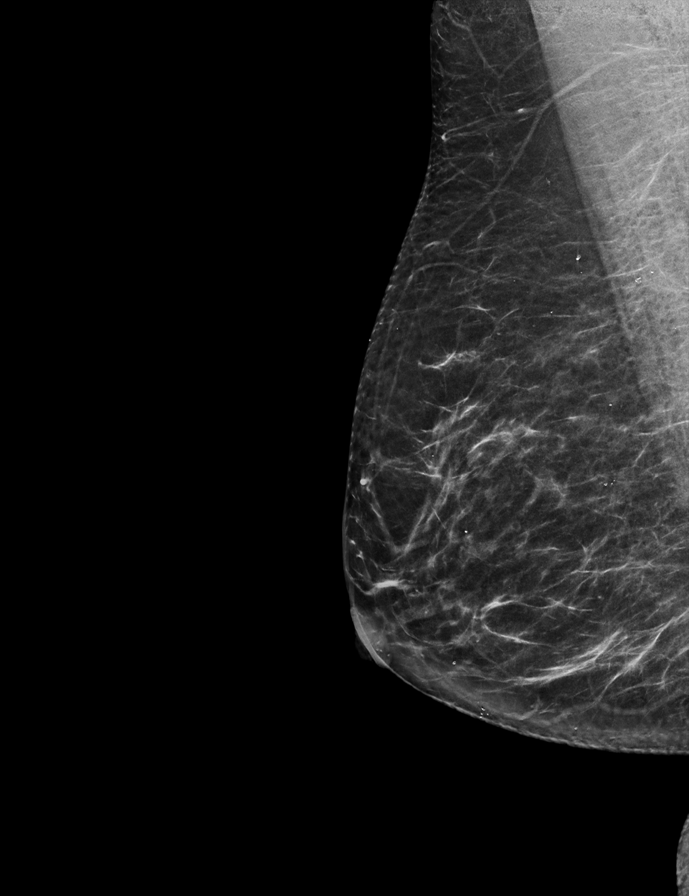

[L MLO synth-2D]
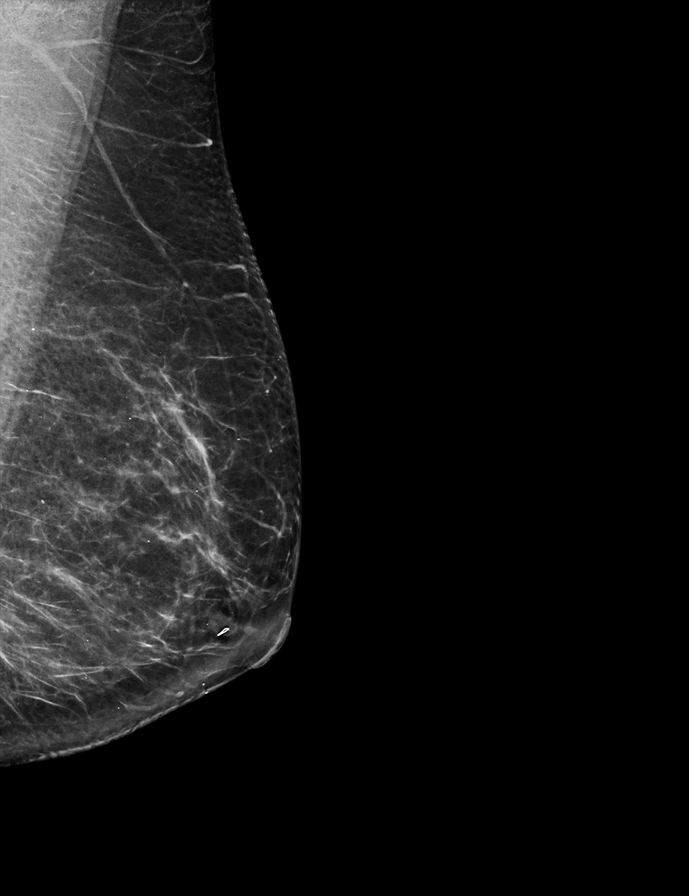

[L MLO tomo · 2 of 69 frames shown]
[frame 23/69]
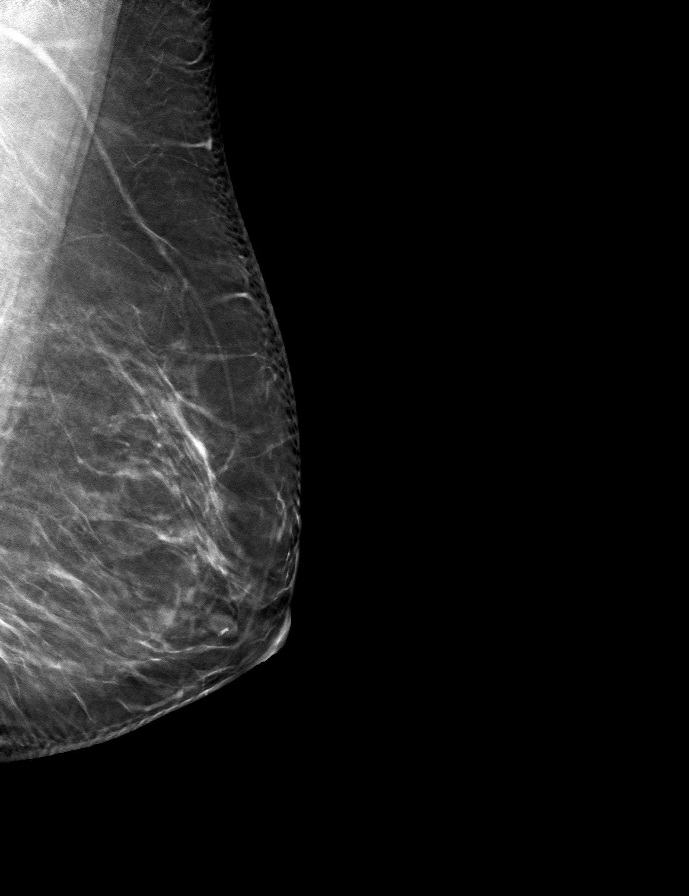
[frame 35/69]
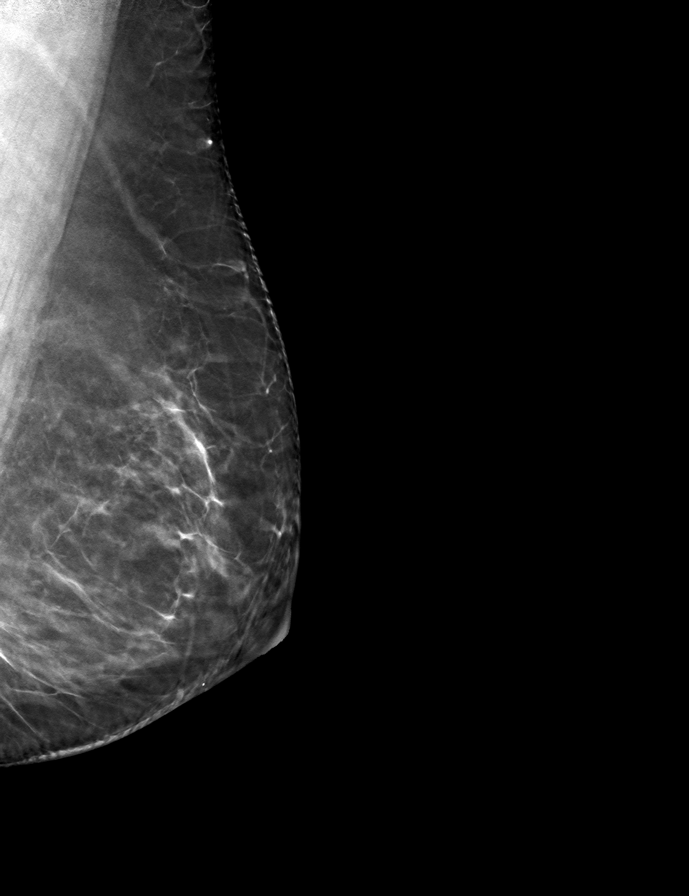

[L CC tomo · tomo slice 35/68.0]
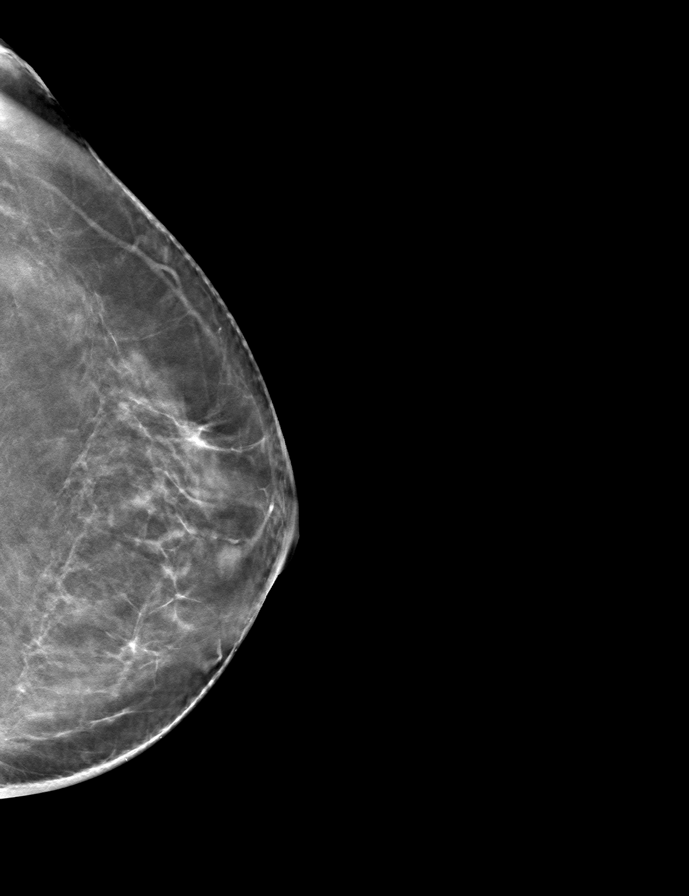

[R MLO tomo · tomo slice 33/64.0]
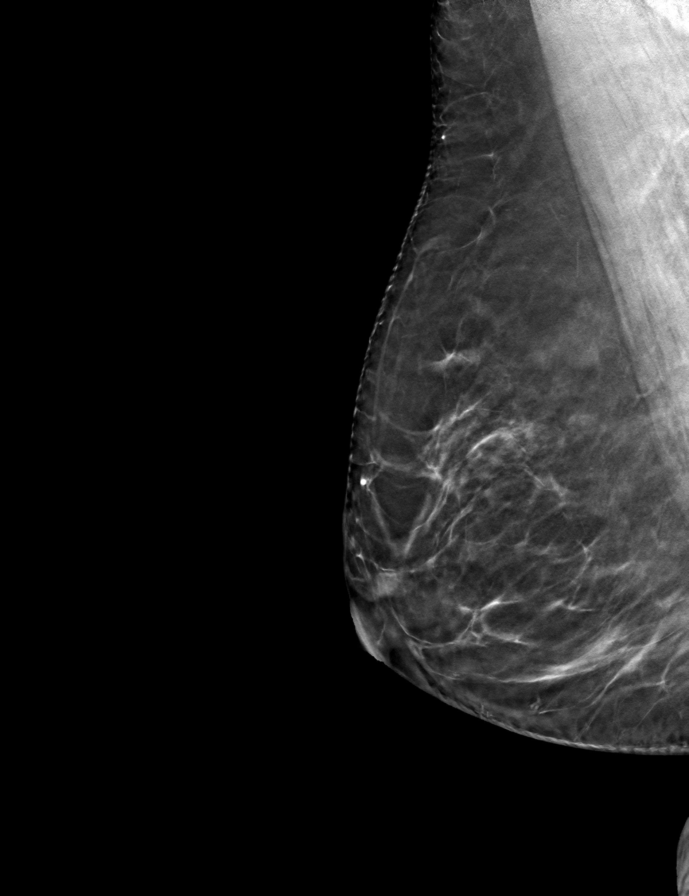

[R CC tomo · tomo slice 35/69.0]
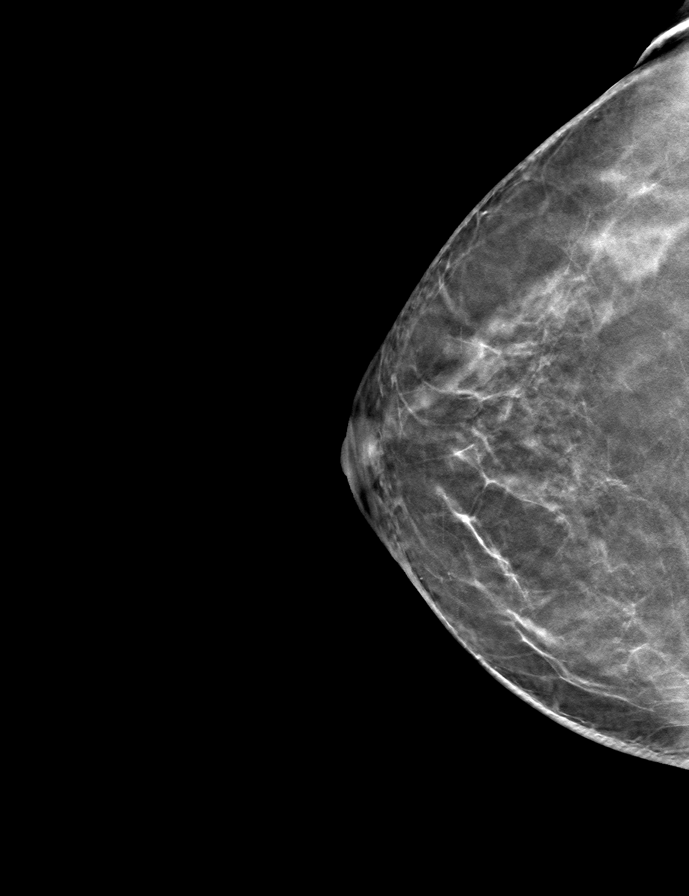

[9 of 24 positions shown; findings below may reference images not displayed]

ACR Breast Density Category b: There are scattered areas of
fibroglandular density.
FINDINGS: There are no findings suspicious for malignancy. The images were
evaluated with computer-aided detection.
IMPRESSION: No mammographic evidence of malignancy. A result letter of this
screening mammogram will be mailed directly to the patient.

RECOMMENDATION:
Screening mammogram in one year. (Code:[id number])

BI-RADS CATEGORY  1: Negative.

## 2022-07-12 ENCOUNTER — Encounter: Payer: Self-pay | Admitting: Family Medicine

## 2022-07-12 ENCOUNTER — Ambulatory Visit: Payer: Self-pay | Attending: Family Medicine | Admitting: Family Medicine

## 2022-07-12 VITALS — BP 124/76 | HR 87 | Temp 98.0°F | Ht 62.0 in | Wt 145.0 lb

## 2022-07-12 DIAGNOSIS — Z1211 Encounter for screening for malignant neoplasm of colon: Secondary | ICD-10-CM

## 2022-07-12 DIAGNOSIS — M542 Cervicalgia: Secondary | ICD-10-CM

## 2022-07-12 DIAGNOSIS — Z1159 Encounter for screening for other viral diseases: Secondary | ICD-10-CM

## 2022-07-12 DIAGNOSIS — R7303 Prediabetes: Secondary | ICD-10-CM

## 2022-07-12 DIAGNOSIS — Z1231 Encounter for screening mammogram for malignant neoplasm of breast: Secondary | ICD-10-CM

## 2022-07-12 DIAGNOSIS — K5909 Other constipation: Secondary | ICD-10-CM

## 2022-07-12 DIAGNOSIS — Z23 Encounter for immunization: Secondary | ICD-10-CM

## 2022-07-12 DIAGNOSIS — Z13228 Encounter for screening for other metabolic disorders: Secondary | ICD-10-CM

## 2022-07-12 LAB — POCT GLYCOSYLATED HEMOGLOBIN (HGB A1C): HbA1c, POC (prediabetic range): 5.8 % (ref 5.7–6.4)

## 2022-07-12 MED ORDER — POLYETHYLENE GLYCOL 3350 17 GM/SCOOP PO POWD: 17.0000 g | Freq: Every day | ORAL | 1 refills | Status: AC

## 2022-07-12 MED ORDER — TIZANIDINE HCL 4 MG PO TABS: 4.0000 mg | ORAL_TABLET | Freq: Three times a day (TID) | ORAL | 1 refills | Status: AC | PRN

## 2022-07-12 NOTE — Patient Instructions (Signed)
Cervical Radiculopathy  Cervical radiculopathy means that a nerve in the neck (a cervical nerve) is pinched or bruised. This can happen because of an injury to the cervical spine (vertebrae) in the neck, or as a normal part of getting older. This condition can cause pain or loss of feeling (numbness) that runs from your neck all the way down to your arm and fingers. Often, this condition gets better with rest. Treatment may be needed if the condition does not get better. What are the causes? A neck injury. A bulging disk in your spine. Sudden muscle tightening (muscle spasms). Tight muscles in your neck due to overuse. Arthritis. Breakdown in the bones and joints of the spine (spondylosis) due to getting older. Bone spurs that form near the nerves in the neck. What are the signs or symptoms? Pain. The pain may: Run from the neck to the arm and hand. Be very bad or irritating. Get worse when you move your neck. Loss of feeling or tingling in your arm or hand. Weakness in your arm or hand, in very bad cases. How is this treated? In many cases, treatment is not needed for this condition. With rest, the condition often gets better over time. If treatment is needed, options may include: Wearing a soft neck collar (cervical collar) for short periods of time. Doing exercises (physical therapy) to strengthen your neck muscles. Taking medicines. Having shots (injections) in your spine, in very bad cases. Having surgery. This may be needed if other treatments do not help. The type of surgery that is used will depend on the cause of your condition. Follow these instructions at home: If you have a soft neck collar: Wear it as told by your doctor. Take it off only as told by your doctor. Ask your doctor if you can take the collar off for cleaning and bathing. If you are allowed to take the collar off for cleaning or bathing: Follow instructions from your doctor about how to take off the collar  safely. Clean the collar by wiping it with mild soap and water and drying it completely. Take out any removable pads in the collar every 1-2 days. Wash them by hand with soap and water. Let them air-dry completely before you put them back in the collar. Check your skin under the collar for redness or sores. If you see any, tell your doctor. Managing pain     Take over-the-counter and prescription medicines only as told by your doctor. If told, put ice on the painful area. To do this: If you have a soft neck collar, take if off as told by your doctor. Put ice in a plastic bag. Place a towel between your skin and the bag. Leave the ice on for 20 minutes, 2-3 times a day. Take off the ice if your skin turns bright red. This is very important. If you cannot feel pain, heat, or cold, you have a greater risk of damage to the area. If using ice does not help, you can try using heat. Use the heat source that your doctor recommends, such as a moist heat pack or a heating pad. Place a towel between your skin and the heat source. Leave the heat on for 20-30 minutes. Take off the heat if your skin turns bright red. This is very important. If you cannot feel pain, heat, or cold, you have a greater risk of getting burned. You may try a gentle neck and shoulder rub (massage). Activity Rest as needed. Return   to your normal activities when your doctor says that it is safe. Do exercises as told by your doctor or physical therapist. You may have to avoid lifting. Ask your doctor how much you can safely lift. General instructions Use a flat pillow when you sleep. Do not drive while wearing a soft neck collar. If you do not have a soft neck collar, ask your doctor if it is safe to drive while your neck heals. Ask your doctor if you should avoid driving or using machines while you are taking your medicine. Do not smoke or use any products that contain nicotine or tobacco. If you need help quitting, ask your  doctor. Keep all follow-up visits. Contact a doctor if: Your condition does not get better with treatment. Get help right away if: Your pain gets worse and medicine does not help. You lose feeling or feel weak in your hand, arm, face, or leg. You have a high fever. Your neck is stiff. You cannot control when you poop or pee (have incontinence). You have trouble with walking, balance, or talking. Summary Cervical radiculopathy means that a nerve in the neck is pinched or bruised. A nerve can get pinched from a bulging disk, arthritis, an injury to the neck, or other causes. Symptoms include pain, tingling, or loss of feeling that goes from the neck to the arm or hand. Weakness in your arm or hand can happen in very bad cases. Treatment may include resting, wearing a soft neck collar, and doing exercises. You might need to take medicines for pain. In very bad cases, shots or surgery may be needed. This information is not intended to replace advice given to you by your health care provider. Make sure you discuss any questions you have with your health care provider. Document Revised: 12/09/2020 Document Reviewed: 12/09/2020 Elsevier Patient Education  2023 Elsevier Inc.  

## 2022-07-12 NOTE — Addendum Note (Signed)
Addended by: Charlott Rakes on: 07/12/2022 11:40 AM   Modules accepted: Level of Service

## 2022-07-12 NOTE — Progress Notes (Signed)
Pain in back and neck

## 2022-07-12 NOTE — Progress Notes (Addendum)
Subjective:  Patient ID: Stacy Wheeler, female    DOB: February 28, 1961  Age: 62 y.o. MRN: [id number]  CC: New Patient (Initial Visit)   HPI Stacy Wheeler Fredrickson is a 62 y.o. year old female with a history of Prediabetes, Hyperlipidemia  Interval History:  Chart reveal a history of 'thyroid disease' however she is unsure of the specifics and last TFTs were normal in 2019.. In the past she was on a statin but has not been taking it of recent.  For the last 2 weeks she has had posterior neck and upper back pain  which radiates to arms bilaterally rated as 7/10 and intermittent exacerbated by worrying. This is associated with tingling in her hands. She has not noticed reduced hand grip Denies heavy lifting. Advil provides some relief. She is right and dominant. Complains of constipation and bowel movements for horrid.She Complains of constipation and hard bowel movements.  Past Medical History:  Diagnosis Date   Hyperlipemia    Thyroid disease     Past Surgical History:  Procedure Laterality Date   CESAREAN SECTION      Family History  Problem Relation Age of Onset   Cancer Maternal Uncle        liver   Diabetes Maternal Grandmother    Hypertension Maternal Grandmother    Heart disease Mother     Social History   Socioeconomic History   Marital status: Married    Spouse name: Not on file   Number of children: 4   Years of education: Not on file   Highest education level: 5th grade  Occupational History   Not on file  Tobacco Use   Smoking status: Never   Smokeless tobacco: Never  Vaping Use   Vaping Use: Never used  Substance and Sexual Activity   Alcohol use: No   Drug use: No   Sexual activity: Yes    Birth control/protection: None  Other Topics Concern   Not on file  Social History Narrative   Not on file   Social Determinants of Health   Financial Resource Strain: Not on file  Food Insecurity: Not on file  Transportation Needs: No  Transportation Needs (10/19/2020)   PRAPARE - Hydrologist (Medical): No    Lack of Transportation (Non-Medical): No  Physical Activity: Not on file  Stress: Not on file  Social Connections: Not on file    No Known Allergies  Outpatient Medications Prior to Visit  Medication Sig Dispense Refill   aspirin 81 MG tablet Take 81 mg by mouth daily. (Patient not taking: Reported on 07/12/2022)     omega-3 acid ethyl esters (LOVAZA) 1 g capsule Take by mouth 2 (two) times daily. (Patient not taking: Reported on 07/12/2022)     polyethylene glycol powder (GLYCOLAX/MIRALAX) powder Take 17 g by mouth daily. (Patient not taking: Reported on 07/12/2022) 225 g 1   No facility-administered medications prior to visit.     ROS Review of Systems  Constitutional:  Negative for activity change and appetite change.  HENT:  Negative for sinus pressure and sore throat.   Respiratory:  Negative for chest tightness, shortness of breath and wheezing.   Cardiovascular:  Negative for chest pain and palpitations.  Gastrointestinal:  Positive for constipation. Negative for abdominal distention and abdominal pain.  Genitourinary: Negative.   Musculoskeletal:        See HPI  Psychiatric/Behavioral:  Negative for behavioral problems and dysphoric mood.  Objective:  BP 124/76   Pulse 87   Temp 98 F (36.7 C) (Oral)   Ht 5\' 2"  (1.575 m)   Wt 145 lb (65.8 kg)   SpO2 100%   BMI 26.52 kg/m      07/12/2022    8:40 AM 10/19/2020    1:55 PM 10/19/2017    2:41 PM  BP/Weight  Systolic BP 124 124 122  Diastolic BP 76 80 78  Wt. (Lbs) 145 158.6 161.6  BMI 26.52 kg/m2 29.01 kg/m2 29.56 kg/m2      Physical Exam Constitutional:      Appearance: She is well-developed.  Cardiovascular:     Rate and Rhythm: Normal rate.     Heart sounds: Normal heart sounds. No murmur heard. Pulmonary:     Effort: Pulmonary effort is normal.     Breath sounds: Normal breath sounds. No wheezing  or rales.  Chest:     Chest wall: No tenderness.  Abdominal:     General: Bowel sounds are normal. There is no distension.     Palpations: Abdomen is soft. There is no mass.     Tenderness: There is no abdominal tenderness.  Musculoskeletal:        General: Normal range of motion.     Cervical back: Normal range of motion. Tenderness (slight tenderness to palpation on odontoid process of cervical spine) present.     Right lower leg: No edema.     Left lower leg: No edema.  Neurological:     Mental Status: She is alert and oriented to person, place, and time.  Psychiatric:        Mood and Affect: Mood normal.        Latest Ref Rng & Units 10/19/2017    3:44 PM 10/31/2013    2:15 PM 05/11/2010    8:31 PM  CMP  Glucose 65 - 99 mg/dL 05/13/2010  89  92   BUN 6 - 24 mg/dL 6   7   Creatinine 387 - 1.00 mg/dL 5.64   3.32   Sodium 9.51 - 144 mmol/L 141   139   Potassium 3.5 - 5.2 mmol/L 4.3   4.9   Chloride 96 - 106 mmol/L 102   102   CO2 20 - 29 mmol/L 24   26   Calcium 8.7 - 10.2 mg/dL 884   9.9   Total Protein 6.0 - 8.5 g/dL 7.9   7.8   Total Bilirubin 0.0 - 1.2 mg/dL 0.3   0.5   Alkaline Phos 39 - 117 IU/L 88   69   AST 0 - 40 IU/L 18   24   ALT 0 - 32 IU/L 21   20     Lipid Panel     Component Value Date/Time   CHOL 219 (H) 10/19/2017 1544   TRIG 225 (H) 10/19/2017 1544   HDL 34 (L) 10/19/2017 1544   CHOLHDL 6.4 (H) 10/19/2017 1544   CHOLHDL 5.4 10/31/2013 1135   VLDL 26 10/31/2013 1135   LDLCALC 140 (H) 10/19/2017 1544    CBC    Component Value Date/Time   WBC 7.0 10/19/2017 1544   WBC 8.1 05/11/2010 2031   RBC 4.29 10/19/2017 1544   RBC 4.25 05/11/2010 2031   HGB 12.3 10/19/2017 1544   HCT 38.4 10/19/2017 1544   PLT 389 (H) 10/19/2017 1544   MCV 90 10/19/2017 1544   MCH 28.7 10/19/2017 1544   MCHC 32.0 10/19/2017 1544   MCHC 31.4  05/11/2010 2031   RDW 13.8 10/19/2017 1544    Lab Results  Component Value Date   HGBA1C 5.8 07/12/2022    Lab Results   Component Value Date   TSH 4.460 10/19/2017    Assessment & Plan:  1. Prediabetes Labs reveal prediabetes with an A1c of 5.8.  Working on a low carbohydrate diet, exercise, weight loss is recommended in order to prevent progression to type 2 diabetes mellitus.  - POCT glycosylated hemoglobin (Hb A1C)  2. Cervicalgia She has noticed this to occur more when she is stressed Stress management will be helpful Advised to apply heat or ice whichever is tolerated to painful areas. Counseled on evidence of improvement in pain control with regards to yoga, water aerobics, massage, home physical therapy, exercise as tolerated. - tiZANidine (ZANAFLEX) 4 MG tablet; Take 1 tablet (4 mg total) by mouth every 8 (eight) hours as needed for muscle spasms.  Dispense: 60 tablet; Refill: 1  3. Screening for colon cancer - Fecal occult blood, imunochemical([facility])   4. Encounter for screening mammogram for malignant neoplasm of breast - [facility] DIGITAL SCREENING TOMO BILATERAL; Future  5. Screening for metabolic disorder - T4, free; Future - TSH; Future - T3; Future - LP+Non-HDL Cholesterol; Future - CMP14+EGFR; Future - CBC with Differential/Platelet; Future  6. Screening for viral disease - HCV Ab w Reflex to Quant PCR; Future - HIV Antibody (routine testing w rflx); Future  7. Need for immunization against influenza - Flu Vaccine QUAD 76mo+IM (Fluarix, Fluzone & Alfiuria Quad PF)  8. Other constipation Counseled on increasing fiber intake, fruits and vegetable, limit intake of foods like cheese, white bread, white rice  - polyethylene glycol powder (GLYCOLAX/MIRALAX) 17 GM/SCOOP powder; Take 17 g by mouth daily.  Dispense: 225 g; Refill: 1   Meds ordered this encounter  Medications   tiZANidine (ZANAFLEX) 4 MG tablet    Sig: Take 1 tablet (4 mg total) by mouth every 8 (eight) hours as needed for muscle spasms.    Dispense:  60 tablet    Refill:  1   polyethylene glycol powder  (GLYCOLAX/MIRALAX) 17 GM/SCOOP powder    Sig: Take 17 g by mouth daily.    Dispense:  225 g    Refill:  1    Follow-up: Return in about 1 month (around 08/12/2022) for Pap smear.       Charlott Rakes, MD, FAAFP. [facility] [place], [place]   07/12/2022, 9:26 AM

## 2022-08-09 ENCOUNTER — Other Ambulatory Visit: Payer: Self-pay | Admitting: *Deleted

## 2022-08-10 ENCOUNTER — Ambulatory Visit: Payer: Self-pay | Admitting: Physician Assistant

## 2022-08-18 ENCOUNTER — Ambulatory Visit
Admission: RE | Admit: 2022-08-18 | Discharge: 2022-08-18 | Disposition: A | Discharge status: Home / Self Care | Payer: No Typology Code available for payment source | Source: Ambulatory Visit | Attending: Family Medicine | Admitting: Family Medicine

## 2022-08-18 DIAGNOSIS — Z1231 Encounter for screening mammogram for malignant neoplasm of breast: Secondary | ICD-10-CM

## 2023-05-28 ENCOUNTER — Ambulatory Visit: Payer: Self-pay | Admitting: Internal Medicine
